# Patient Record
Sex: Male | Born: 1995 | Race: Asian | Hispanic: No | Marital: Single | Smoking: Never smoker
Health system: Southern US, Community
[De-identification: ages and names within clinical notes are randomized; demographics above are authoritative.]

---
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
## 2017-09-14 ENCOUNTER — Emergency Department (HOSPITAL_BASED_OUTPATIENT_CLINIC_OR_DEPARTMENT_OTHER): Payer: No Typology Code available for payment source

## 2017-09-14 ENCOUNTER — Emergency Department (HOSPITAL_BASED_OUTPATIENT_CLINIC_OR_DEPARTMENT_OTHER)
Admission: EM | Admit: 2017-09-14 | Discharge: 2017-09-14 | Disposition: A | Discharge status: Home / Self Care | Payer: No Typology Code available for payment source | Attending: Emergency Medicine | Admitting: Emergency Medicine

## 2017-09-14 ENCOUNTER — Encounter (HOSPITAL_BASED_OUTPATIENT_CLINIC_OR_DEPARTMENT_OTHER): Payer: Self-pay | Admitting: Emergency Medicine

## 2017-09-14 ENCOUNTER — Other Ambulatory Visit: Payer: Self-pay

## 2017-09-14 DIAGNOSIS — Y998 Other external cause status: Secondary | ICD-10-CM | POA: Diagnosis not present

## 2017-09-14 DIAGNOSIS — Y9389 Activity, other specified: Secondary | ICD-10-CM | POA: Diagnosis not present

## 2017-09-14 DIAGNOSIS — W278XXA Contact with other nonpowered hand tool, initial encounter: Secondary | ICD-10-CM | POA: Diagnosis not present

## 2017-09-14 DIAGNOSIS — S8252XB Displaced fracture of medial malleolus of left tibia, initial encounter for open fracture type I or II: Secondary | ICD-10-CM | POA: Diagnosis not present

## 2017-09-14 DIAGNOSIS — Y929 Unspecified place or not applicable: Secondary | ICD-10-CM | POA: Diagnosis not present

## 2017-09-14 DIAGNOSIS — S91012A Laceration without foreign body, left ankle, initial encounter: Secondary | ICD-10-CM | POA: Insufficient documentation

## 2017-09-14 DIAGNOSIS — S8992XA Unspecified injury of left lower leg, initial encounter: Secondary | ICD-10-CM | POA: Diagnosis present

## 2017-09-14 DIAGNOSIS — Z23 Encounter for immunization: Secondary | ICD-10-CM | POA: Insufficient documentation

## 2017-09-14 MED ORDER — CEPHALEXIN 500 MG PO CAPS: 500.0000 mg | ORAL_CAPSULE | Freq: Two times a day (BID) | ORAL | 0 refills | Status: AC

## 2017-09-14 MED ORDER — LIDOCAINE-EPINEPHRINE (PF) 2 %-1:200000 IJ SOLN
10.0000 mL | Freq: Once | INTRAMUSCULAR | Status: AC
  Administered 2017-09-14: 10 mL
  Filled 2017-09-14: qty 10

## 2017-09-14 MED ORDER — TETANUS-DIPHTH-ACELL PERTUSSIS 5-2.5-18.5 LF-MCG/0.5 IM SUSP
0.5000 mL | Freq: Once | INTRAMUSCULAR | Status: AC
  Administered 2017-09-14: 0.5 mL via INTRAMUSCULAR
  Filled 2017-09-14: qty 0.5

## 2017-09-14 MED ORDER — IBUPROFEN 800 MG PO TABS: 800.0000 mg | ORAL_TABLET | Freq: Three times a day (TID) | ORAL | 0 refills | Status: AC

## 2017-09-14 MED ORDER — HYDROCODONE-ACETAMINOPHEN 5-325 MG PO TABS: 1.0000 | ORAL_TABLET | Freq: Four times a day (QID) | ORAL | 0 refills | Status: AC | PRN

## 2017-09-14 MED ORDER — CEFAZOLIN SODIUM-DEXTROSE 1-4 GM/50ML-% IV SOLN
1.0000 g | Freq: Once | INTRAVENOUS | Status: AC
  Administered 2017-09-14: 1 g via INTRAVENOUS
  Filled 2017-09-14: qty 50

## 2017-09-14 MED FILL — IBUPROFEN 800 MG TAB: 800 | 10 days supply | Qty: 30 | Fill #0

## 2017-09-14 MED FILL — CEPHALEXIN 500 MG CAPSULE: 500 | 10 days supply | Qty: 20 | Fill #0

## 2017-09-14 MED FILL — HYDROCODON-APAP 5-325: 5-325 | 1 days supply | Qty: 6 | Fill #0

## 2017-09-14 NOTE — ED Triage Notes (Signed)
Laceration to inner aspect of left ankle.  Pt cut with axe.  Bleeding controlled.

## 2017-09-14 NOTE — ED Provider Notes (Signed)
[facility] EMERGENCY DEPARTMENT Provider Note   CSN: [id number] Arrival date & time: 09/14/17  1038     History   Chief Complaint Chief Complaint  Patient presents with  . Laceration    HPI Steve SellsSoley Mcdowell is a 21 y.o. male with laceration of left medial ankle.  Pt states that his foot was up on a stump when he was cutting with an ax.  The ax bounced off and hit his left ankle.  This occurred around 8:00 this morning.  He had acute onset left ankle pain, and was unable to bear weight due to pain.  He has a cut over the medial malleolus of the L ankle.  No injuries elsewhere.  He denies numbness or tingling.  He has no difficulty moving his toes.  He is not on blood thinners, has no other medical problems.  Has not taken anything for pain.  States the ax was new, and this was the first time being used.  Tetanus last updated 4 or 5 years ago, patient requests tetanus update today.  HPI  No past medical history on file.  Patient Active Problem List   Diagnosis Date Noted  . ABDOMINAL PAIN, ACUTE 03/16/2009    No past surgical history on file.     Home Medications    Prior to Admission medications   Medication Sig Start Date End Date Taking? Authorizing Provider  cephALEXin (KEFLEX) 500 MG capsule Take 1 capsule (500 mg total) 2 (two) times daily for 10 days by mouth. 09/14/17 09/24/17  Fiorella Hanahan, PA-C  HYDROcodone-acetaminophen (NORCO/VICODIN) 5-325 MG tablet Take 1 tablet every 6 (six) hours as needed by mouth for severe pain. 09/14/17   Acie Custis, PA-C  ibuprofen (ADVIL,MOTRIN) 800 MG tablet Take 1 tablet (800 mg total) 3 (three) times daily with meals by mouth. 09/14/17   Alexandr Yaworski, PA-C    Family History No family history on file.  Social History Social History   Tobacco Use  . Smoking status: Never Smoker  . Smokeless tobacco: Never Used  Substance Use Topics  . Alcohol use: Not on file  . Drug use: Not on file     Allergies     Patient has no known allergies.   Review of Systems Review of Systems  Musculoskeletal: Positive for arthralgias.  Skin: Positive for wound.  Neurological: Negative for numbness.  Hematological: Does not bruise/bleed easily.     Physical Exam Updated Vital Signs BP 131/80 (BP Location: Right Arm)   Pulse 82   Temp 99.2 F (37.3 C) (Oral)   Resp 16   Ht 5\' 10"  (1.778 m)   Wt 76.2 kg (168 lb)   SpO2 100%   BMI 24.11 kg/m   Physical Exam  Constitutional: He is oriented to person, place, and time. He appears well-developed and well-nourished. No distress.  HENT:  Head: Normocephalic and atraumatic.  Eyes: EOM are normal.  Neck: Normal range of motion.  Pulmonary/Chest: Effort normal.  Abdominal: He exhibits no distension.  Musculoskeletal: He exhibits edema and tenderness.  Swelling of medial left malleolus.  Pain with movement of the ankle.  Pedal pulses intact bilaterally.  Sensation intact bilaterally.  Color and warmth equal bilaterally.  Patient able to move toes without difficulty.  No pain of lower leg.  Soft compartments.  Neurological: He is alert and oriented to person, place, and time.  Skin: Skin is warm. No rash noted.  Laceration of medial left ankle.  See picture below.  Hematoma underlying  the laceration.  Persistent slow ooze from the lac.  No obvious laceration elsewhere.  Psychiatric: He has a normal mood and affect.  Nursing note and vitals reviewed.        ED Treatments / Results  Labs (all labs ordered are listed, but only abnormal results are displayed) Labs Reviewed - No data to display  EKG  EKG Interpretation None       Radiology Dg Ankle Complete Left  Result Date: 09/14/2017 CLINICAL DATA:  Patient hit medial ankle with an ax. Open wound. Laceration to the medial malleolus. EXAM: LEFT ANKLE COMPLETE - 3+ VIEW COMPARISON:  None. FINDINGS: Extensive soft tissue swelling is present over the medial malleolus. There is a minimally  displaced fracture at the medial malleolus. The ankle joint is located. There is no significant joint effusion. No radiopaque foreign body is present. IMPRESSION: 1. Minimally displaced fracture along the medial malleolus. 2. Large soft tissue swelling and laceration over the medial malleolus. 3. No radiopaque foreign body. Electronically Signed   By: Marin Roberts M.D.   On: 09/14/2017 12:15    Procedures .Marland KitchenLaceration Repair Date/Time: 09/14/2017 1:25 PM Performed by: Alveria Apley, PA-C Authorized by: Alveria Apley, PA-C   Consent:    Consent obtained:  Verbal   Consent given by:  Patient   Risks discussed:  Infection, pain, poor cosmetic result and poor wound healing Anesthesia (see MAR for exact dosages):    Anesthesia method:  Local infiltration   Local anesthetic:  Lidocaine 2% WITH epi Laceration details:    Location:  Foot   Foot location:  L ankle   Length (cm):  2   Depth (mm):  5 Repair type:    Repair type:  Intermediate Exploration:    Wound exploration: wound explored through full range of motion and entire depth of wound probed and visualized     Wound exploration comment:  Wound explored to its fullest extent in a nonbloody field   Wound extent comment:  Bony involvement, no visualized tendon or vascular damage.  No foreign bodies noted. Treatment:    Area cleansed with:  Saline   Amount of cleaning:  Extensive   Irrigation solution:  Sterile saline   Irrigation volume:  2000 mL   Irrigation method:  Syringe Skin repair:    Repair method:  Sutures   Suture size:  4-0   Suture material:  Prolene   Suture technique:  Simple interrupted   Number of sutures:  3 Approximation:    Approximation:  Loose Post-procedure details:    Dressing:  Open (no dressing)   Patient tolerance of procedure:  Tolerated well, no immediate complications   (including critical care time)  Medications Ordered in ED Medications  Tdap (BOOSTRIX) injection 0.5 mL (0.5  mLs Intramuscular Given 09/14/17 1158)  lidocaine-EPINEPHrine (XYLOCAINE W/EPI) 2 %-1:200000 (PF) injection 10 mL (10 mLs Infiltration Given 09/14/17 1200)  ceFAZolin (ANCEF) IVPB 1 g/50 mL premix (0 g Intravenous Stopped 09/14/17 1350)     Initial Impression / Assessment and Plan / ED Course  I have reviewed the triage vital signs and the nursing notes.  Pertinent labs & imaging results that were available during my care of the patient were reviewed by me and considered in my medical decision making (see chart for details).     Patient presenting with injury of the left medial malleolus from an ax.  He has had persistent pain and oozing from the laceration.  Patient is neurovascularly intact.  Tetanus updated today.  X-ray shows minimal displaced fracture along the medial malleolus.  Case discussed with attending, Dr. Criss AlvineGoldston evaluated the patient.  Orthopedics called for consult.  Dr. Ophelia CharterYates from orthopedics recommended irrigation and loose closure of wound.  Patient be placed in a Cam walker and follow-up in office.  Laceration irrigated and repaired.  Dressing placed.  Aftercare instructions given.  Patient to use Tylenol and ibuprofen for pain, Norco given for breakthrough.  A Cam walker applied.  Discussed plan with patient.  At this time, patient appears safe for discharge.  Return precautions given.  Patient states he understands and agrees to plan.   Final Clinical Impressions(s) / ED Diagnoses   Final diagnoses:  Type I or II open displaced fracture of medial malleolus of left tibia, initial encounter    ED Discharge Orders        Ordered    cephALEXin (KEFLEX) 500 MG capsule  2 times daily     09/14/17 1350    ibuprofen (ADVIL,MOTRIN) 800 MG tablet  3 times daily with meals     09/14/17 1350    HYDROcodone-acetaminophen (NORCO/VICODIN) 5-325 MG tablet  Every 6 hours PRN     09/14/17 1350       Reva Pinkley, PA-C 09/14/17 1807    Pricilla LovelessGoldston, Scott, MD 09/15/17  [phone]

## 2017-09-14 NOTE — Discharge Instructions (Signed)
Take ibuprofen 3 times a day with meals for pain and swelling.  Use Norco as needed for breakthrough pain.  Do not drive or operate heavy machinery while you are taking pain medicine. Use the Cam walker while walking to help support your foot and ankle. Take the antibiotics as prescribed.  Take the entire course unless instructed otherwise. Keep the dressing on your foot for the next 24 hours.  After this, you may remove dressing, wash gently soap and water, and reapply a new dressing.  There will likely be continued bleeding and drainage from the cut. Follow-up with Dr. Ophelia CharterYates from orthopedics.  His office will call you to schedule appointment on Monday or Tuesday.  If you have not heard from them, you should give them a call for follow-up. Return to the emergency room if your foot turns white, numbness of your foot, fevers, chills, or redness streaking up or down your leg.

## 2017-09-26 ENCOUNTER — Ambulatory Visit (INDEPENDENT_AMBULATORY_CARE_PROVIDER_SITE_OTHER): Payer: Self-pay | Admitting: Orthopaedic Surgery

## 2017-09-28 ENCOUNTER — Encounter (INDEPENDENT_AMBULATORY_CARE_PROVIDER_SITE_OTHER): Payer: Self-pay | Admitting: Orthopaedic Surgery

## 2017-09-28 ENCOUNTER — Ambulatory Visit (INDEPENDENT_AMBULATORY_CARE_PROVIDER_SITE_OTHER): Payer: PRIVATE HEALTH INSURANCE

## 2017-09-28 ENCOUNTER — Ambulatory Visit (INDEPENDENT_AMBULATORY_CARE_PROVIDER_SITE_OTHER): Payer: PRIVATE HEALTH INSURANCE | Admitting: Orthopaedic Surgery

## 2017-09-28 ENCOUNTER — Telehealth (INDEPENDENT_AMBULATORY_CARE_PROVIDER_SITE_OTHER): Payer: Self-pay | Admitting: Orthopaedic Surgery

## 2017-09-28 VITALS — BP 124/72 | HR 78 | Ht 70.0 in | Wt 170.0 lb

## 2017-09-28 DIAGNOSIS — S91012D Laceration without foreign body, left ankle, subsequent encounter: Secondary | ICD-10-CM | POA: Diagnosis not present

## 2017-09-28 DIAGNOSIS — S8252XB Displaced fracture of medial malleolus of left tibia, initial encounter for open fracture type I or II: Secondary | ICD-10-CM

## 2017-09-28 MED ORDER — CEPHALEXIN 500 MG PO CAPS: 500.0000 mg | ORAL_CAPSULE | Freq: Four times a day (QID) | ORAL | 0 refills | Status: AC

## 2017-09-28 NOTE — Progress Notes (Signed)
Office Visit Note   Patient: Steve Mcdowell           Date of Birth: Apr 02, 1996           MRN: [id number] Visit Date: 09/28/2017              Requested by: No referring provider defined for this encounter. PCP: Glennis BrinkBowen, Karen E, DO (Inactive)   Assessment & Plan: Visit Diagnoses:  1. Type I or II open displaced fracture of medial malleolus of left tibia, initial encounter   2. Laceration of left ankle, subsequent encounter     Plan: Patient seen with Dr. Ophelia CharterYates today. Follow-up in 1 week for wound check and possible suture removal at that time. Elevate foot to decrease swelling. 5 more days of Keflex. No aggressive activity. He can discontinue Cam Walker as long as his tennis shoe does not rub his wound.  Follow-Up Instructions: Return in about 1 week (around 10/05/2017) for wound check Yates.   Orders:  Orders Placed This Encounter  Procedures  . XR Ankle Complete Left   Meds ordered this encounter  Medications  . cephALEXin (KEFLEX) 500 MG capsule    Sig: Take 1 capsule (500 mg total) by mouth 4 (four) times daily.    Dispense:  20 capsule    Refill:  0      Procedures: No procedures performed   Clinical Data: No additional findings.   Subjective: Chief Complaint  Patient presents with  . Left Ankle - Fracture    HPI Patient comes in for evaluation of left ankle injury. Patient was seen in the emergency room 09/14/2017 after suffering a laceration and medial malleolar chip fracture from an axe injury.  Patient states that he was using an ax when had bounced off the wood and hit his ankle.  He was seen in the and treated with wound irrigation and loose wound closure. ER physician did contact Dr. Ophelia CharterYates and patient was advised to follow up in our clinic. States that he is normal. Not complaining of too much discomfort. Stop taking narcotic pain medication. He was prescribed Keflex and is still taking this. No complaints of fever chills.  Review of Systems No current  cardiopulmonary GI GU issues  Objective: Vital Signs: BP 124/72   Pulse 78   Ht 5\' 10"  (1.778 m)   Wt 170 lb (77.1 kg)   BMI 24.39 kg/m   Physical Exam  Constitutional: He is oriented to person, place, and time. He appears well-developed and well-nourished. No distress.  HENT:  Head: Normocephalic and atraumatic.  Eyes: EOM are normal. Pupils are equal, round, and reactive to light.  Pulmonary/Chest: No respiratory distress.  Musculoskeletal:  Left medial ankle wound is healing well. Had minimal serous drainage. Sutures are intact. No gross signs of infection. Ankle joint nontender. Peroneal tendons intact. Neurovascularly intact.  Neurological: He is alert and oriented to person, place, and time.  Psychiatric: He has a normal mood and affect.    Ortho Exam  Specialty Comments:  No specialty comments available.  Imaging: No results found.   PMFS History: Patient Active Problem List   Diagnosis Date Noted  . ABDOMINAL PAIN, ACUTE 03/16/2009   History reviewed. No pertinent past medical history.  History reviewed. No pertinent family history.  History reviewed. No pertinent surgical history. Social History   Occupational History  . Not on file  Tobacco Use  . Smoking status: Never Smoker  . Smokeless tobacco: Never Used  Substance and Sexual Activity  .  Alcohol use: Not on file  . Drug use: Not on file  . Sexual activity: Not on file

## 2017-09-28 NOTE — Telephone Encounter (Signed)
Patient needs a note for his employer stating when he can go back to work. He would like the note faxed if possible [phone]. Attn: Alinda DoomsAnna Ward

## 2017-09-28 NOTE — Telephone Encounter (Signed)
Note faxed per patient's request. I tried to call patient to advise. [employer] customer unavailable. Cannot leave message.

## 2017-09-28 NOTE — Telephone Encounter (Signed)
Out of work atleast 2 weeks per Zonia KiefJames Owens, PA-C

## 2017-10-02 ENCOUNTER — Ambulatory Visit (INDEPENDENT_AMBULATORY_CARE_PROVIDER_SITE_OTHER): Payer: Self-pay | Admitting: Orthopaedic Surgery

## 2017-10-09 ENCOUNTER — Ambulatory Visit (INDEPENDENT_AMBULATORY_CARE_PROVIDER_SITE_OTHER): Payer: PRIVATE HEALTH INSURANCE | Admitting: Orthopaedic Surgery

## 2017-10-09 ENCOUNTER — Encounter (INDEPENDENT_AMBULATORY_CARE_PROVIDER_SITE_OTHER): Payer: Self-pay | Admitting: Orthopaedic Surgery

## 2017-10-09 VITALS — BP 135/78 | HR 106 | Ht 71.0 in | Wt 170.0 lb

## 2017-10-09 DIAGNOSIS — S91012D Laceration without foreign body, left ankle, subsequent encounter: Secondary | ICD-10-CM

## 2017-10-09 NOTE — Progress Notes (Signed)
   Office Visit Note   Patient: Steve Mcdowell           Date of Birth: 1996-05-01           MRN: [id number] Visit Date: 10/09/2017              Requested by: No referring provider defined for this encounter. PCP: Glennis BrinkBowen, Karen E, DO (Inactive)   Assessment & Plan: Visit Diagnoses:  1. Laceration of left ankle, subsequent encounter     Plan: Patient doing well at this point. Does have some soreness medial ankle as be expected at this point. He will still avoid any aggressive activity. Follow-up in 3 weeks for recheck.  Advised if he is doing well at that point he may call and cancel the appointment but knows to return if there are any ongoing issues. If he has flareups of his medial ankle pain is okay for him to go into the cam boot as needed. All questions answered. Today sutures were removed and Steri-Strips applied.  Follow-Up Instructions: Return in about 3 weeks (around 10/30/2017).   Orders:  No orders of the defined types were placed in this encounter.  No orders of the defined types were placed in this encounter.     Procedures: No procedures performed   Clinical Data: No additional findings.   Subjective: Chief Complaint  Patient presents with  . Left Ankle - Routine Post Op    HPI Patient doing well. Continues to have some soreness medial ankle as to be expected. Has finished antibiotic. Review of Systems No current cardiac pulmonary GI GU issues  Objective: Vital Signs: BP 135/78   Pulse (!) 106   Ht 5\' 11"  (1.803 m)   Wt 170 lb (77.1 kg)   BMI 23.71 kg/m   Physical Exam  Constitutional: He is oriented to person, place, and time. He appears well-developed. No distress.  HENT:  Head: Normocephalic and atraumatic.  Eyes: EOM are normal. Pupils are equal, round, and reactive to light.  Pulmonary/Chest: No respiratory distress.  Musculoskeletal:  Ankle wound looks good. Sutures removed and Steri-Strips applied. No drainage or signs of infection. Not much  swelling. He is somewhat tender around the medial malleolus but this is decreased from his last visit.  Neurological: He is alert and oriented to person, place, and time.    Ortho Exam  Specialty Comments:  No specialty comments available.  Imaging: No results found.   PMFS History: Patient Active Problem List   Diagnosis Date Noted  . ABDOMINAL PAIN, ACUTE 03/16/2009   History reviewed. No pertinent past medical history.  History reviewed. No pertinent family history.  History reviewed. No pertinent surgical history. Social History   Occupational History  . Not on file  Tobacco Use  . Smoking status: Never Smoker  . Smokeless tobacco: Never Used  Substance and Sexual Activity  . Alcohol use: Not on file  . Drug use: Not on file  . Sexual activity: Not on file

## 2017-10-09 NOTE — Telephone Encounter (Signed)
Patient needs another work note stating when he can go back. If you can also fax this one to -  Attn: Alinda DoomsAnna Ward Fax # [phone].

## 2017-10-09 NOTE — Telephone Encounter (Signed)
What should work note state now?

## 2017-10-11 NOTE — Telephone Encounter (Signed)
Okay to return to work 1 week

## 2017-10-12 NOTE — Telephone Encounter (Signed)
I attempted to call patient and advise. I cannot leave message, it states to try back at a later time. I faxed work note, continue out of work x one more week. Patient may then return to work.

## 2017-10-12 NOTE — Telephone Encounter (Signed)
Faxed work note per patient request.

## 2017-11-06 ENCOUNTER — Ambulatory Visit (INDEPENDENT_AMBULATORY_CARE_PROVIDER_SITE_OTHER): Payer: PRIVATE HEALTH INSURANCE | Admitting: Orthopaedic Surgery

## 2019-06-23 IMAGING — CR DG ANKLE COMPLETE 3+V*L*
3 series · 3 of 3 positions shown · non-contrast
Comparison: None.

CLINICAL DATA: Patient hit medial ankle with an ax. Open wound.
Laceration to the medial malleolus.

EXAM:
LEFT ANKLE COMPLETE - 3+ VIEW

[t ankle joint ap left]
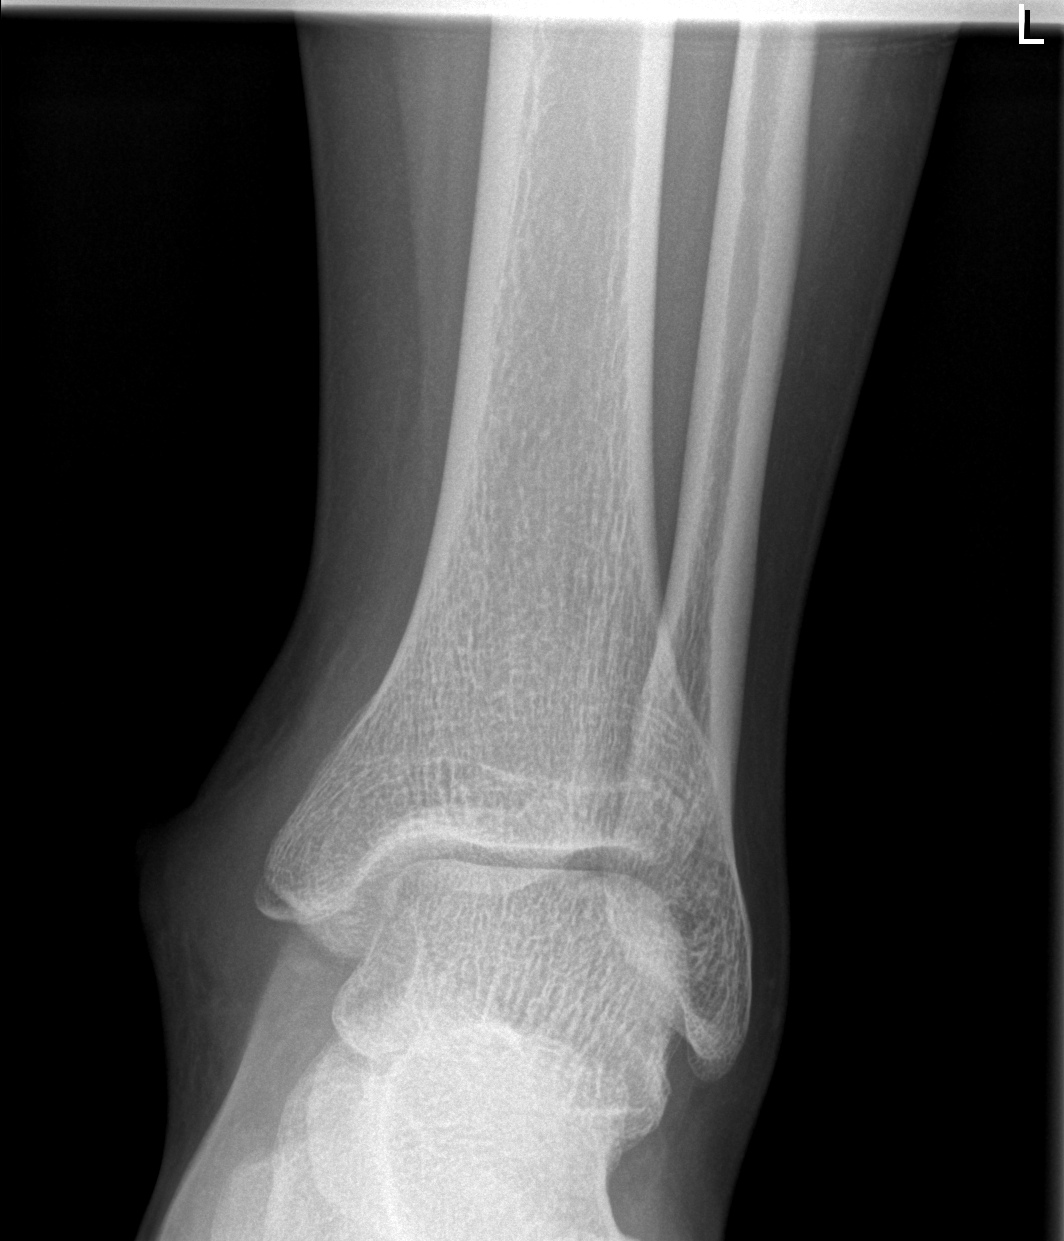

[t ankle joint oblique left]
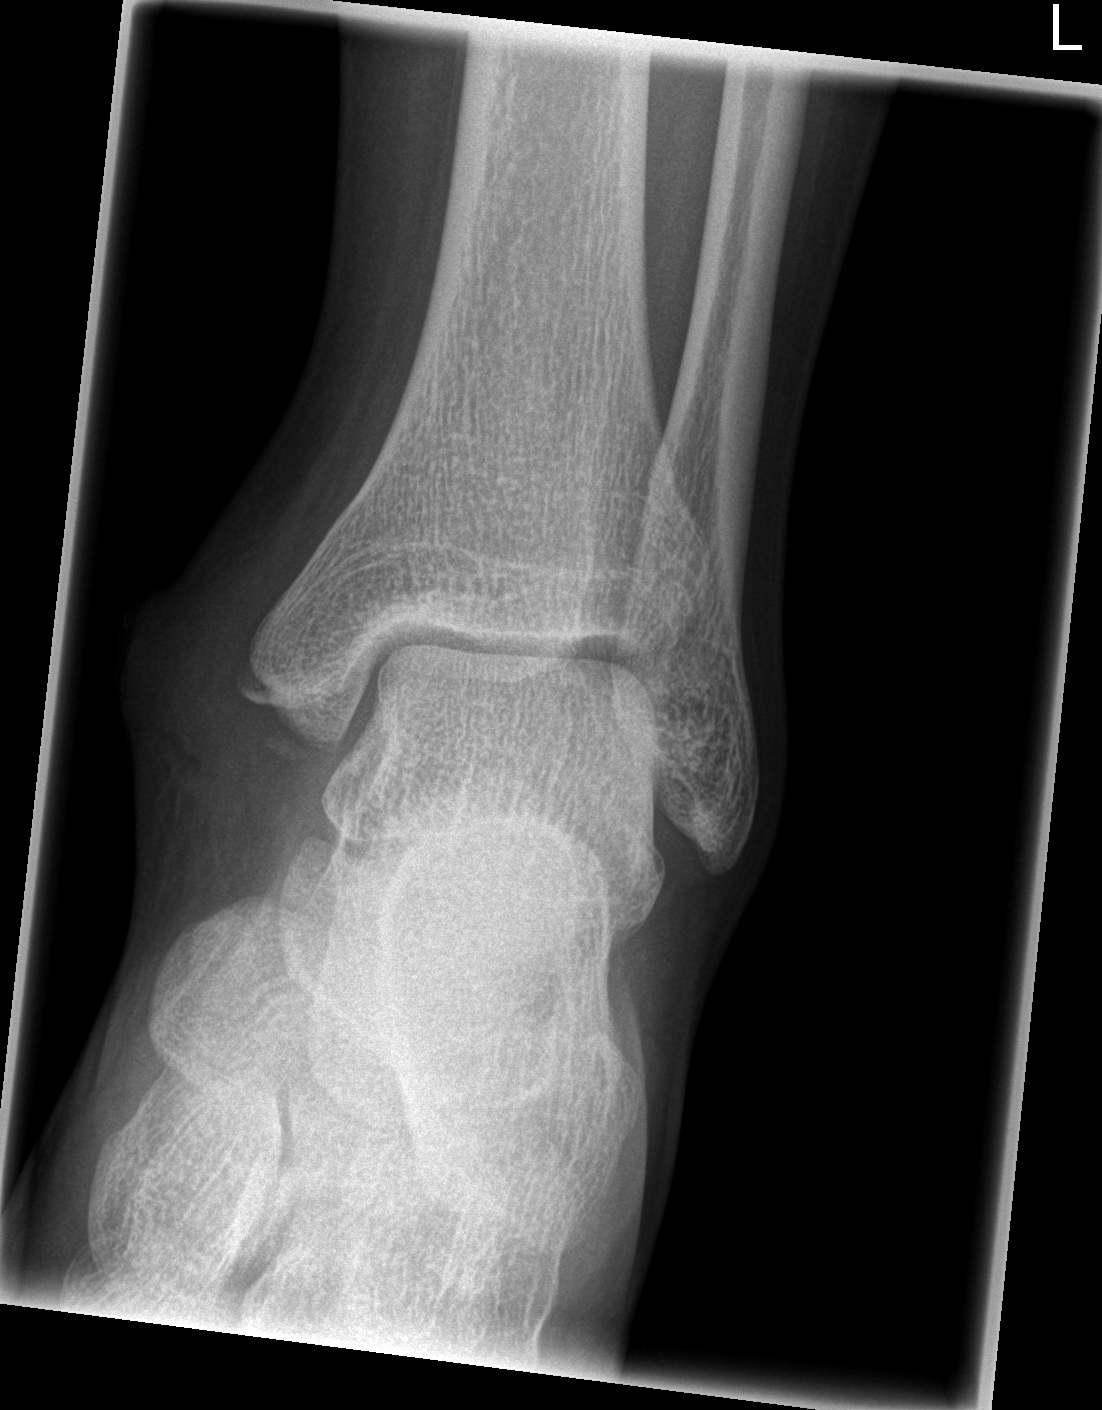

[t ankle joint lat left]
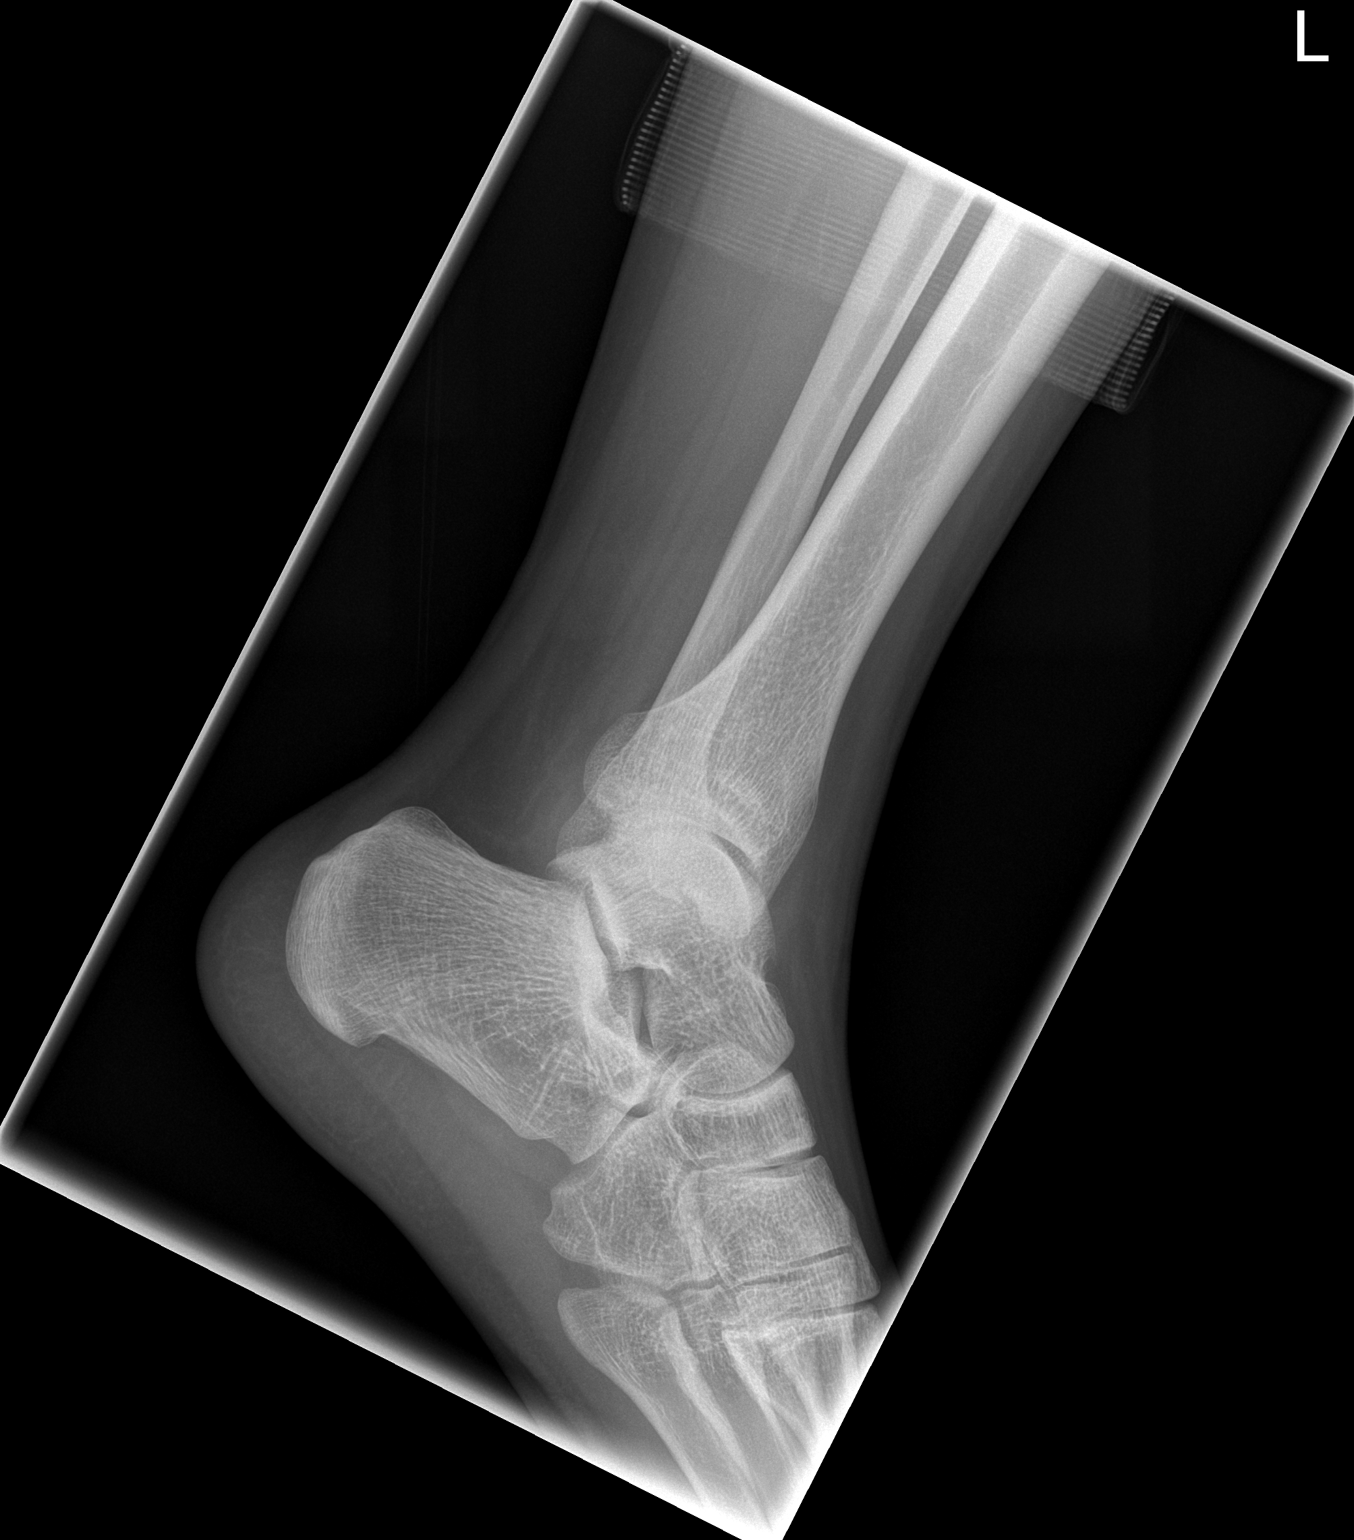

[3 of 3 positions shown; findings below may reference images not displayed]

FINDINGS: Extensive soft tissue swelling is present over the medial malleolus.
There is a minimally displaced fracture at the medial malleolus. The
ankle joint is located. There is no significant joint effusion. No
radiopaque foreign body is present.
IMPRESSION: 1. Minimally displaced fracture along the medial malleolus.
2. Large soft tissue swelling and laceration over the medial
malleolus.
3. No radiopaque foreign body.
